# Patient Record
Sex: Female | Born: 2000 | Race: Black or African American | Hispanic: No | Marital: Single
Health system: Southern US, Community
[De-identification: ages and names within clinical notes are randomized; demographics above are authoritative.]

---
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
## 2020-03-28 ENCOUNTER — Other Ambulatory Visit: Payer: Self-pay

## 2020-03-28 ENCOUNTER — Ambulatory Visit: Payer: Self-pay | Attending: Internal Medicine

## 2020-03-28 DIAGNOSIS — Z23 Encounter for immunization: Secondary | ICD-10-CM

## 2020-03-28 NOTE — Progress Notes (Signed)
   Covid-19 Vaccination Clinic  Name:  Kelsey Gentry    MRN: [id number] DOB: 09/24/2000  03/28/2020  [name] was observed post Covid-19 immunization for 15 minutes without incident. She was provided with Vaccine Information Sheet and instruction to access the V-Safe system.   [name] was instructed to call 911 with any severe reactions post vaccine: Marland Kitchen Difficulty breathing  . Swelling of face and throat  . A fast heartbeat  . A bad rash all over body  . Dizziness and weakness   Immunizations Administered    Name Date Dose VIS Date Route   Moderna Covid-19 Booster Vaccine 03/28/2020  2:38 PM 0.25 mL 12/27/2019 Intramuscular   Manufacturer: Moderna   Lot: [id number]   NDC: 54270-623-76

## 2020-06-12 ENCOUNTER — Emergency Department (HOSPITAL_COMMUNITY)
Admission: EM | Admit: 2020-06-12 | Discharge: 2020-06-12 | Disposition: A | Discharge status: Home / Self Care | Payer: Managed Care, Other (non HMO) | Attending: Emergency Medicine | Admitting: Emergency Medicine

## 2020-06-12 ENCOUNTER — Other Ambulatory Visit: Payer: Self-pay

## 2020-06-12 ENCOUNTER — Encounter (HOSPITAL_COMMUNITY): Payer: Self-pay | Admitting: Emergency Medicine

## 2020-06-12 ENCOUNTER — Emergency Department (HOSPITAL_COMMUNITY): Payer: Managed Care, Other (non HMO)

## 2020-06-12 DIAGNOSIS — M25571 Pain in right ankle and joints of right foot: Secondary | ICD-10-CM

## 2020-06-12 DIAGNOSIS — Y9301 Activity, walking, marching and hiking: Secondary | ICD-10-CM | POA: Diagnosis not present

## 2020-06-12 DIAGNOSIS — S99911A Unspecified injury of right ankle, initial encounter: Secondary | ICD-10-CM | POA: Diagnosis not present

## 2020-06-12 DIAGNOSIS — X501XXA Overexertion from prolonged static or awkward postures, initial encounter: Secondary | ICD-10-CM | POA: Insufficient documentation

## 2020-06-12 NOTE — ED Triage Notes (Signed)
Emergency Medicine Provider Triage Evaluation Note  Kelsey Gentry , a 20 y.o. female  was evaluated in triage.  Pt complains of right ankle pain. Rolled ankle while walking. No other injury  Review of Systems  Positive: Ankle pain Negative: No syncope  Physical Exam  BP 135/86   Pulse 94   Temp 98.4 F (36.9 C) (Oral)   Resp 16   SpO2 100%  Gen:   Awake, no distress   HEENT:  Atraumatic  Resp:  Normal effort  Cardiac:  Normal rate  Abd:   Nondistended, nontender MSK:   Moves extremities without difficulty. Tenderness over lateral malleolus Neuro:  Speech clear   Medical Decision Making  Medically screening exam initiated at 6:32 PM.  Appropriate orders placed.  Kelsey Gentry was informed that the remainder of the evaluation will be completed by another provider, this initial triage assessment does not replace that evaluation, and the importance of remaining in the ED until their evaluation is complete.  Clinical Impression  Ankle pain, likely sprain   Felicie Morn, NP 06/12/20 1834

## 2020-06-12 NOTE — Discharge Instructions (Signed)
Please refer to the attached instructions 

## 2020-06-12 NOTE — ED Notes (Signed)
Ortho Tech paged again for this patient

## 2020-06-12 NOTE — ED Triage Notes (Signed)
Pt arrives POV with complaints of R ankle injury. Pt states she twisted her ankle around 1:30pm today. Pt unable to put pressure on ankle.

## 2020-06-12 NOTE — ED Provider Notes (Signed)
[facility]-EMERGENCY DEPT Provider Note   CSN: [id number] Arrival date & time: 06/12/20  1727     History Chief Complaint  Patient presents with  . Ankle Injury    Kelsey Gentry is a 20 y.o. female.  Patient states she rolled her ankle while walking. Complaining of tenderness to the lateral malleolus. She states she is unable to bear weight without pain.  The history is provided by the patient. No language interpreter was used.  Ankle Injury This is a new problem. The problem has not changed since onset.The symptoms are aggravated by walking. She has tried nothing for the symptoms.       History reviewed. No pertinent past medical history.  There are no problems to display for this patient.   History reviewed. No pertinent surgical history.   OB History   No obstetric history on file.     History reviewed. No pertinent family history.     Home Medications Prior to Admission medications   Not on File    Allergies    Patient has no allergy information on record.  Review of Systems   Review of Systems  Musculoskeletal: Positive for arthralgias.  All other systems reviewed and are negative.   Physical Exam Updated Vital Signs BP 135/86   Pulse 94   Temp 98.4 F (36.9 C) (Oral)   Resp 16   SpO2 100%   Physical Exam Vitals and nursing note reviewed.  Constitutional:      Appearance: Normal appearance.  HENT:     Head: Normocephalic.     Nose: Nose normal.     Mouth/Throat:     Mouth: Mucous membranes are moist.  Eyes:     Conjunctiva/sclera: Conjunctivae normal.  Cardiovascular:     Rate and Rhythm: Normal rate and regular rhythm.  Pulmonary:     Effort: Pulmonary effort is normal.     Breath sounds: Normal breath sounds.  Musculoskeletal:        General: Swelling and tenderness present. No deformity.     Cervical back: Normal range of motion.  Skin:    General: Skin is warm and dry.  Neurological:     Mental  Status: She is alert and oriented to person, place, and time.  Psychiatric:        Mood and Affect: Mood normal.        Behavior: Behavior normal.     ED Results / Procedures / Treatments   Labs (all labs ordered are listed, but only abnormal results are displayed) Labs Reviewed - No data to display  EKG None  Radiology DG Ankle Complete Right  Result Date: 06/12/2020 CLINICAL DATA:  Ankle pain EXAM: RIGHT ANKLE - COMPLETE 3+ VIEW COMPARISON:  None. FINDINGS: There is widening of the tibiotalar joint laterally. No visible fracture. Mild lateral soft tissue swelling. IMPRESSION: Widening of the tibiotalar joint laterally without visible fracture. Mild lateral soft tissue swelling. Electronically Signed   By: Charlett Nose M.D.   On: 06/12/2020 19:16    Procedures Procedures   Medications Ordered in ED Medications - No data to display  ED Course  I have reviewed the triage vital signs and the nursing notes.  Pertinent labs & imaging results that were available during my care of the patient were reviewed by me and considered in my medical decision making (see chart for details).    MDM Rules/Calculators/A&P  Patient X-Ray negative for obvious fracture or dislocation.  Pt advised to follow up with orthopedics. Patient given cam walker and crutches while in ED, conservative therapy recommended and discussed. Patient will be discharged home & is agreeable with above plan. Returns precautions discussed. Pt appears safe for discharge. Final Clinical Impression(s) / ED Diagnoses Final diagnoses:  Acute right ankle pain    Rx / DC Orders ED Discharge Orders    None       Felicie Morn, NP 06/12/20 2203    Rolan Bucco, MD 06/12/20 2210

## 2020-06-12 NOTE — ED Notes (Signed)
Ortho-tech called and paged multiple times with no call back by this RN and Diplomatic Services operational officer; pt stickers left in ortho room.

## 2021-05-26 IMAGING — CR DG ANKLE COMPLETE 3+V*R*
3 series · 3 of 3 positions shown · non-contrast
Comparison: None.

CLINICAL DATA: Ankle pain

EXAM:
RIGHT ANKLE - COMPLETE 3+ VIEW

[x ankle ap right]
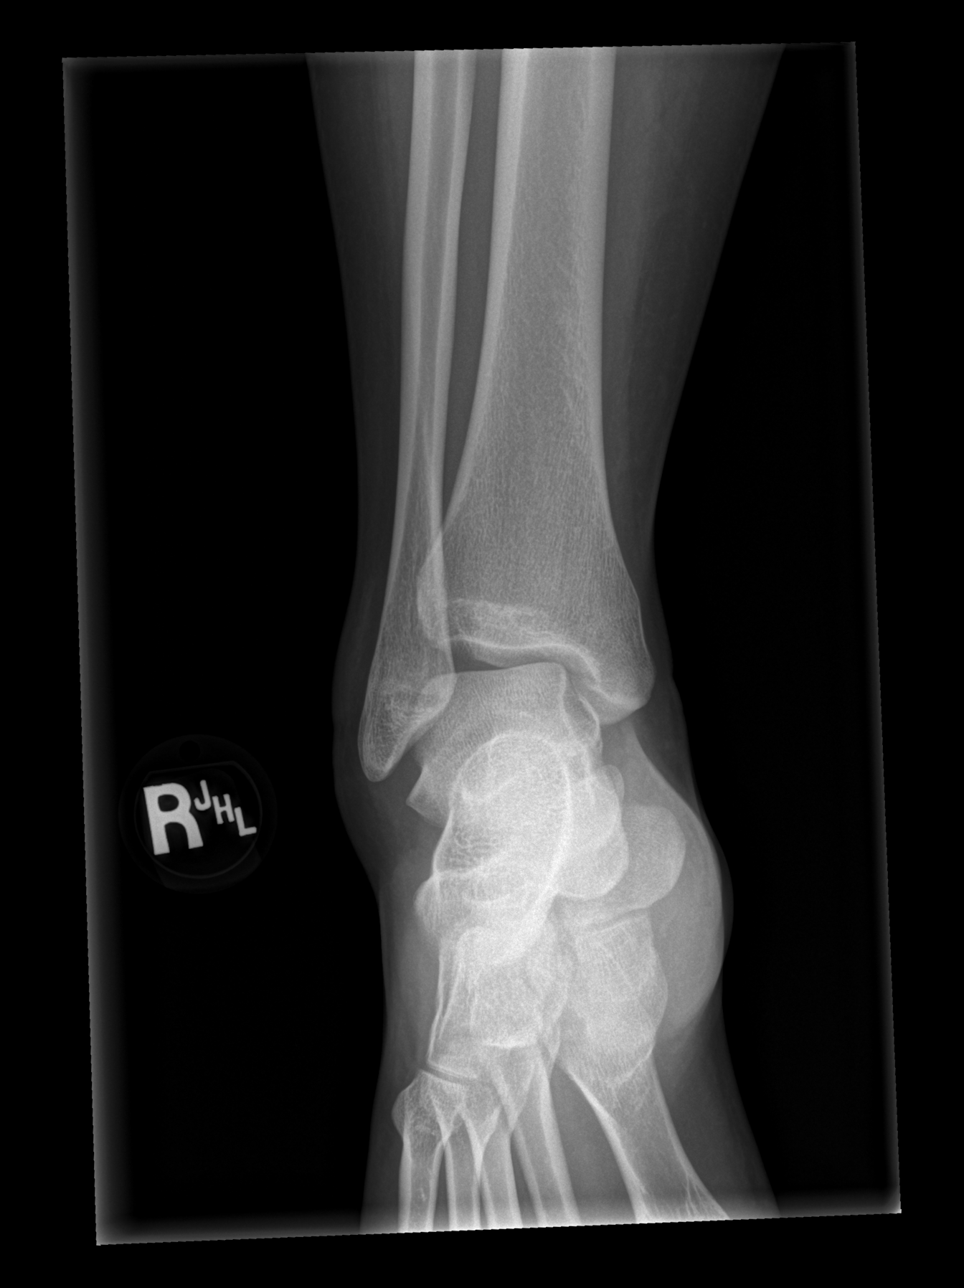

[x ankle obl right]
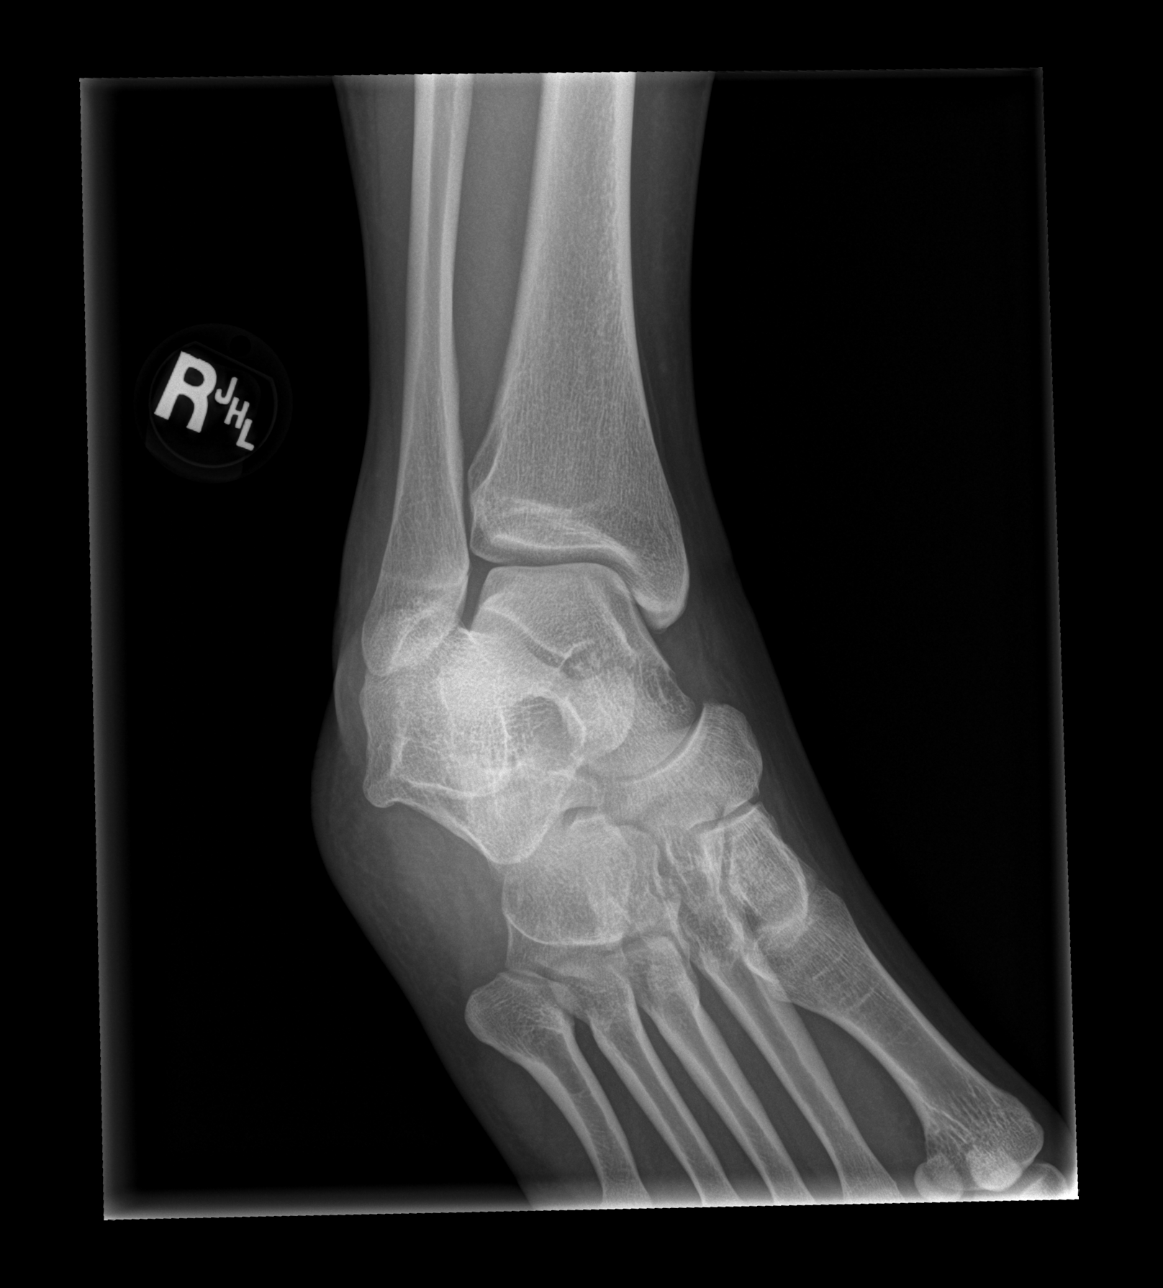

[x ankle lat right]
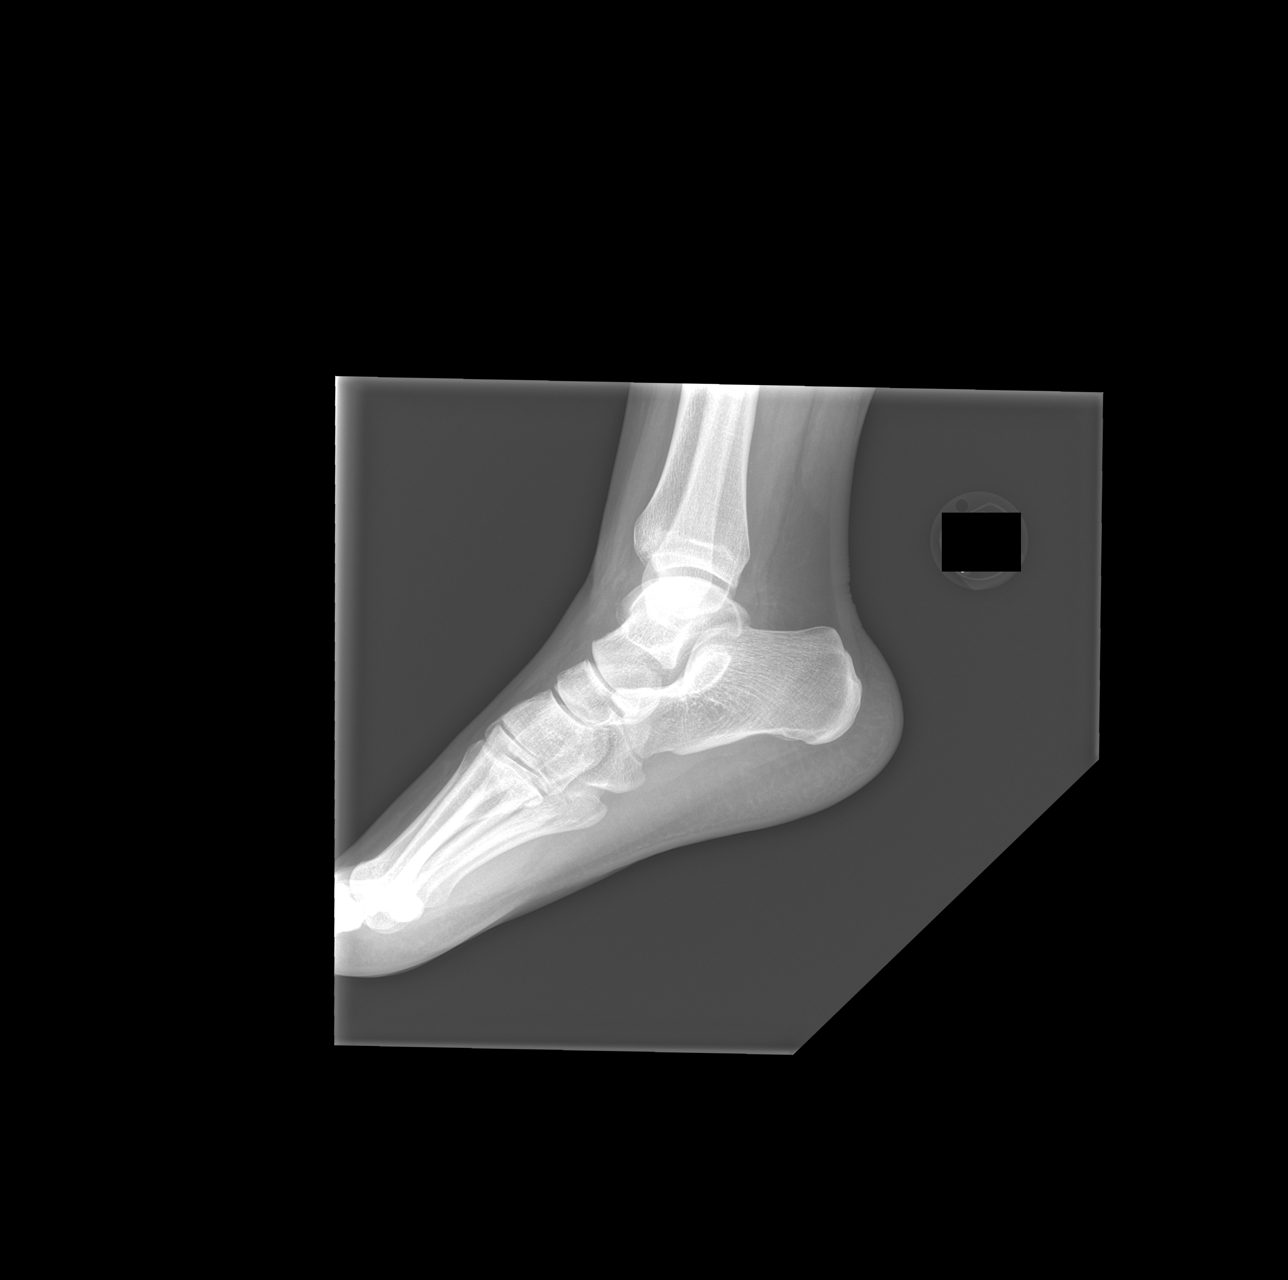

[3 of 3 positions shown; findings below may reference images not displayed]

FINDINGS: There is widening of the tibiotalar joint laterally. No visible
fracture. Mild lateral soft tissue swelling.
IMPRESSION: Widening of the tibiotalar joint laterally without visible fracture.
Mild lateral soft tissue swelling.
